# Patient Record
Sex: Female | Born: 1970 | Race: Black or African American | Hispanic: No | Marital: Single | Smoking: Never smoker
Health system: Southern US, Community
[De-identification: ages and names within clinical notes are randomized; demographics above are authoritative.]

## PROBLEM LIST (undated history)

## (undated) DIAGNOSIS — I10 Essential (primary) hypertension: Secondary | ICD-10-CM

## (undated) HISTORY — DX: Essential (primary) hypertension: I10

---

## 1997-09-30 ENCOUNTER — Emergency Department (HOSPITAL_COMMUNITY): Admission: EM | Admit: 1997-09-30 | Discharge: 1997-09-30 | Payer: Self-pay | Admitting: Emergency Medicine

## 1997-10-01 ENCOUNTER — Encounter: Admission: RE | Admit: 1997-10-01 | Discharge: 1997-12-30 | Payer: Self-pay | Admitting: Internal Medicine

## 1999-05-15 ENCOUNTER — Emergency Department (HOSPITAL_COMMUNITY): Admission: EM | Admit: 1999-05-15 | Discharge: 1999-05-15 | Payer: Self-pay | Admitting: Internal Medicine

## 1999-11-22 ENCOUNTER — Other Ambulatory Visit: Admission: RE | Admit: 1999-11-22 | Discharge: 1999-11-22 | Payer: Self-pay | Admitting: *Deleted

## 2003-05-06 ENCOUNTER — Ambulatory Visit (HOSPITAL_COMMUNITY): Admission: RE | Admit: 2003-05-06 | Discharge: 2003-05-06 | Payer: Self-pay | Admitting: Family Medicine

## 2004-09-03 ENCOUNTER — Other Ambulatory Visit: Admission: RE | Admit: 2004-09-03 | Discharge: 2004-09-03 | Payer: Self-pay | Admitting: Family Medicine

## 2005-01-20 IMAGING — US US PELVIS COMPLETE MODIFY
1 series · 14 of 25 positions shown · non-contrast
Comparison: none

CLINICAL DATA: Left lower quadrant pain.
 TRANSABDOMINAL AND TRANSVAGINAL PELVIC ULTRASOUND 
 The uterus measures 8.4 x 4 x 4.5 cm.  There are two fundal fibroids, one on the right and one on the left measuring 2.1 x 2 cm and 1.6 x 1.5 cm.  The endometrium measures less than 6 mm. The right ovary measures 2.3 x 1.3 x 1.4 cm.  The left ovary measures 3.4 x 2.5 x 2.1 cm.  There is a 1 cm follicular cyst.  There is no free fluid.
 IMPRESSION
 1.  Two small fibroids of the uterine fundus measuring less than 2.1 cm.
 2.  Normal endometrium.
 3.  Normal ovaries for the patient?s age with no free fluid.

[Series 1: unknown · 0.26mm/px · 14 of 60 slices shown]
[im 1/60]
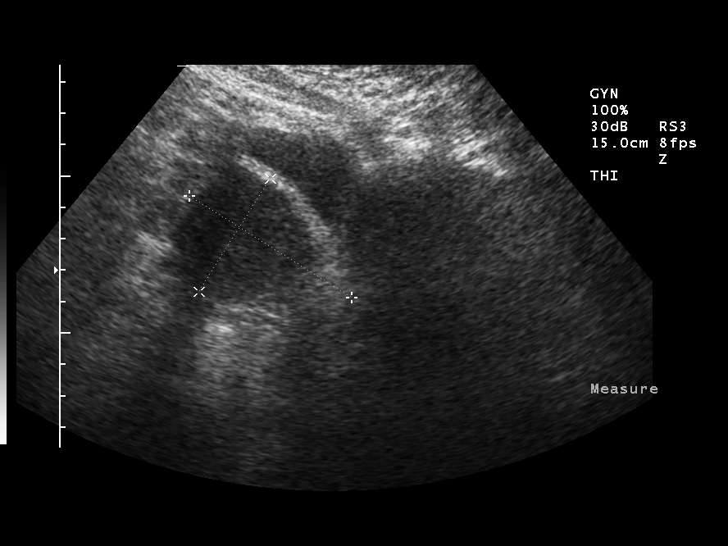
[im 5/60]
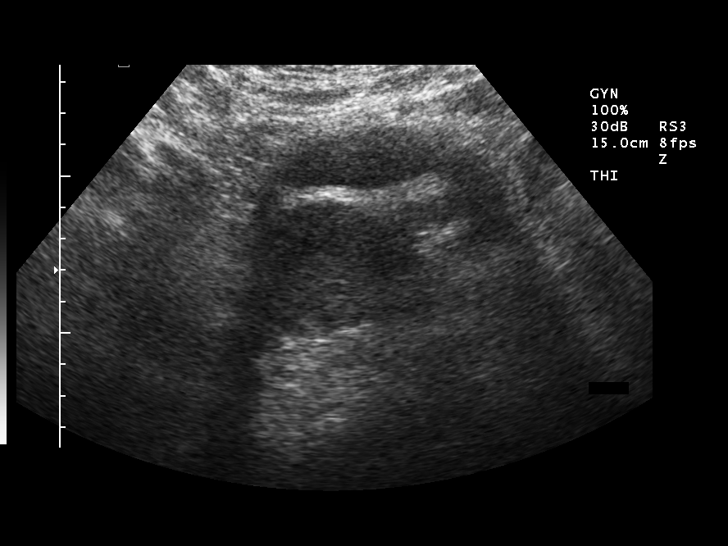
[im 10/60]
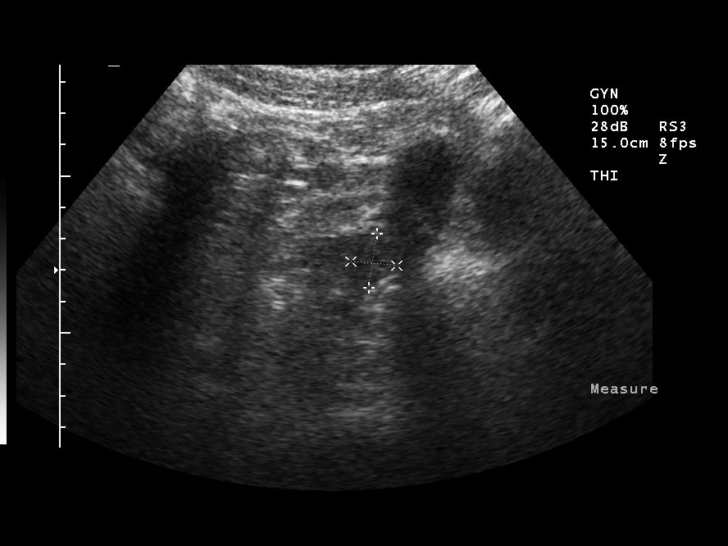
[im 15/60]
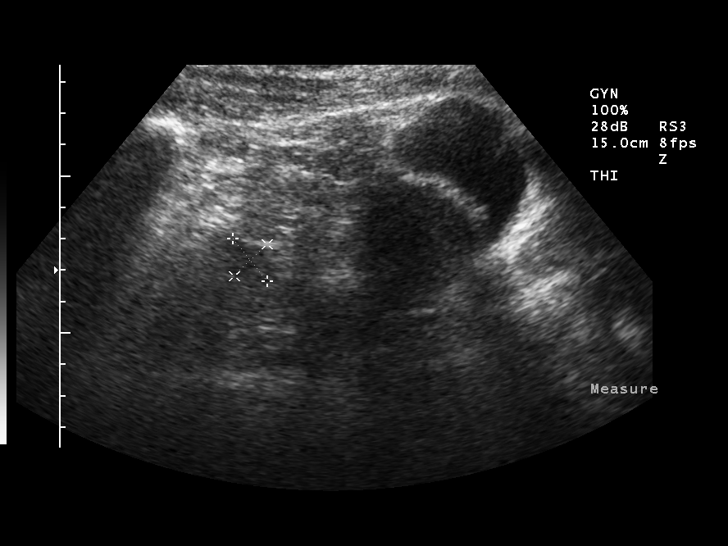
[im 20/60]
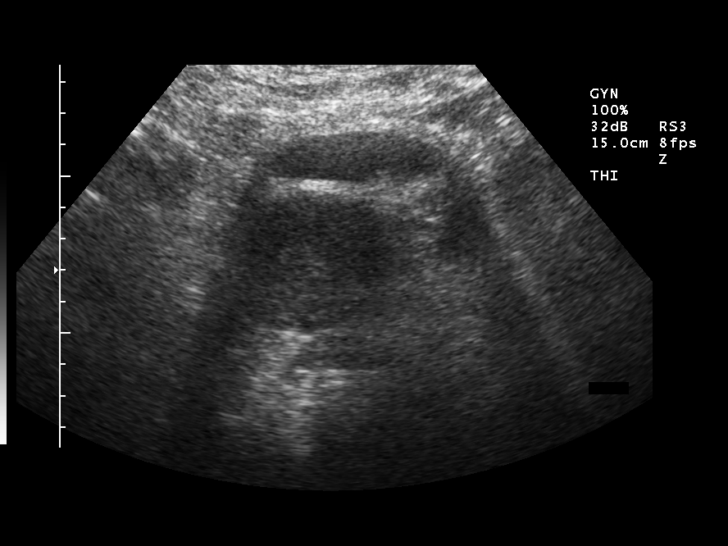
[im 23/60]
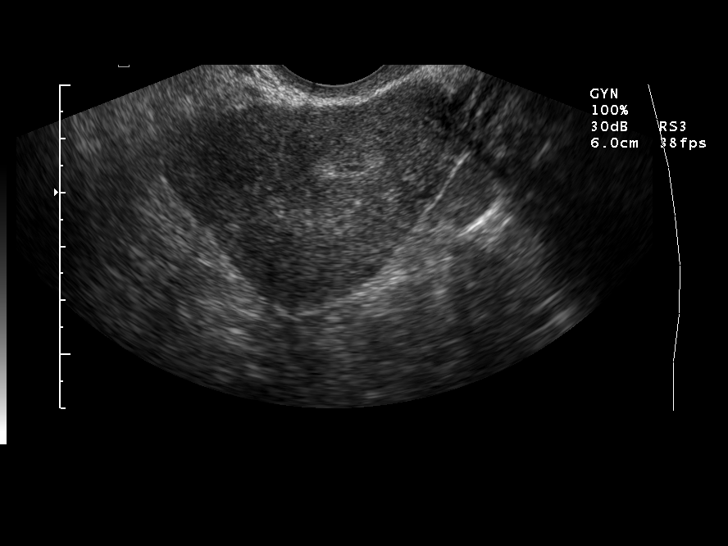
[im 28/60]
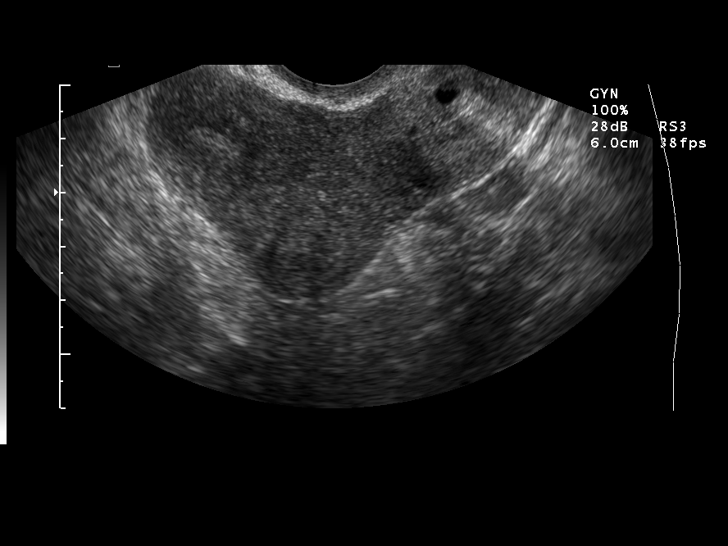
[im 32/60]
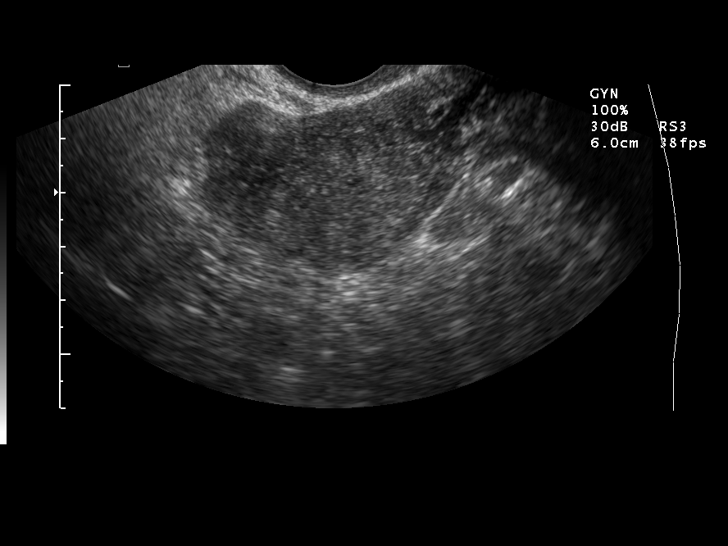
[im 37/60]
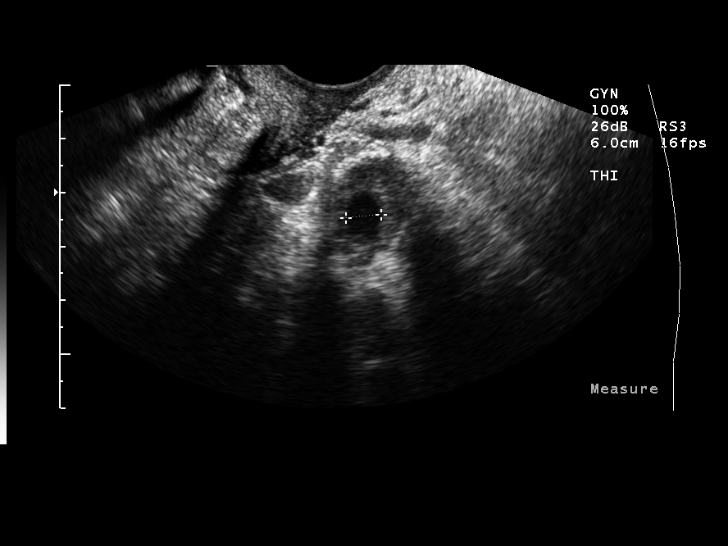
[im 40/60]
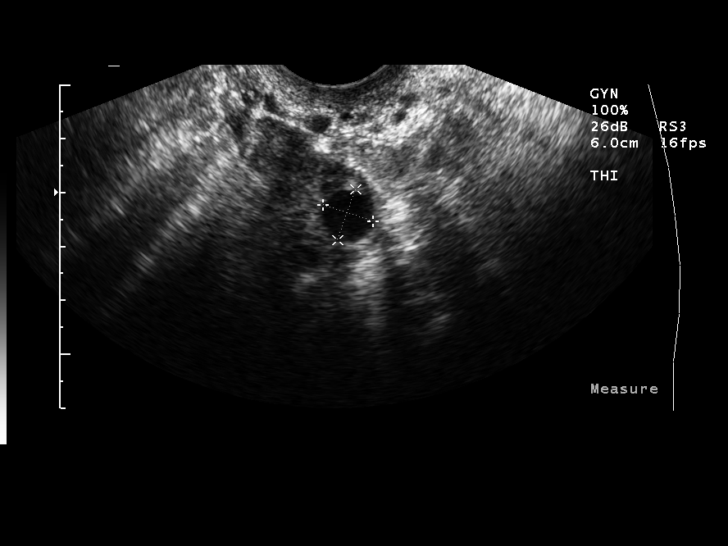
[im 45/60]
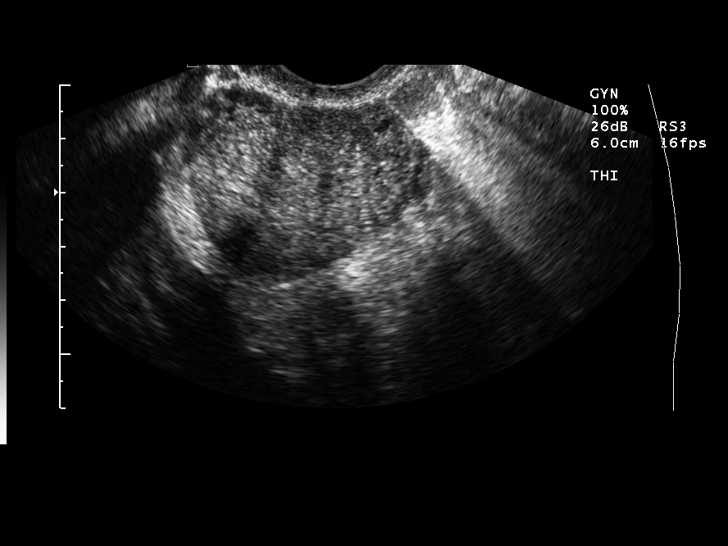
[im 50/60]
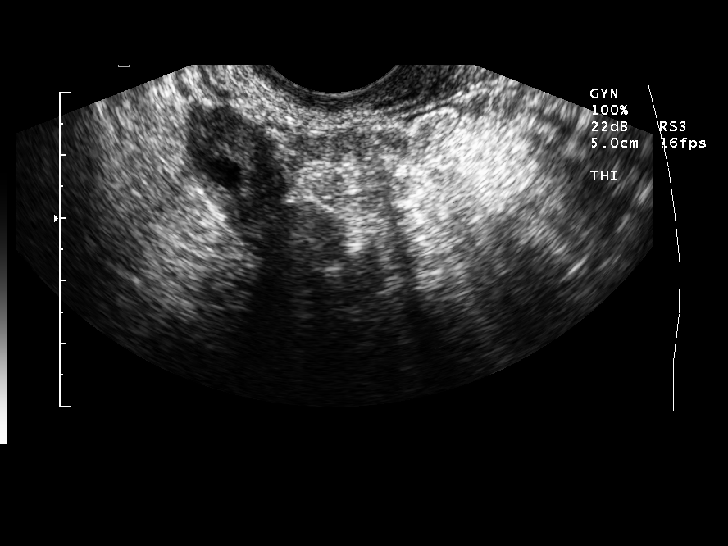
[im 55/60]
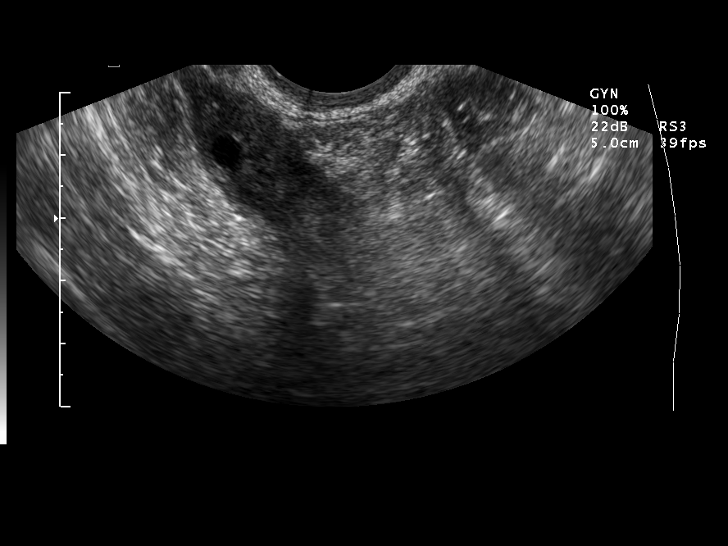
[im 60/60]
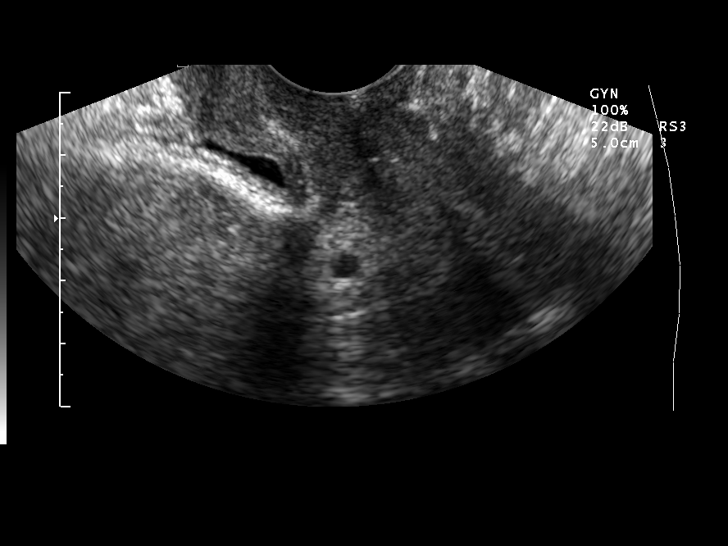

[14 of 25 positions shown; findings below may reference images not displayed]

## 2006-06-19 ENCOUNTER — Other Ambulatory Visit: Admission: RE | Admit: 2006-06-19 | Discharge: 2006-06-19 | Payer: Self-pay | Admitting: Family Medicine

## 2007-06-04 ENCOUNTER — Other Ambulatory Visit: Admission: RE | Admit: 2007-06-04 | Discharge: 2007-06-04 | Payer: Self-pay | Admitting: Family Medicine

## 2008-06-09 ENCOUNTER — Other Ambulatory Visit: Admission: RE | Admit: 2008-06-09 | Discharge: 2008-06-09 | Payer: Self-pay | Admitting: Family Medicine

## 2009-07-13 ENCOUNTER — Other Ambulatory Visit: Admission: RE | Admit: 2009-07-13 | Discharge: 2009-07-13 | Payer: Self-pay | Admitting: Family Medicine

## 2010-11-12 ENCOUNTER — Emergency Department (HOSPITAL_COMMUNITY)
Admission: EM | Admit: 2010-11-12 | Discharge: 2010-11-12 | Disposition: A | Discharge status: Home / Self Care | Payer: No Typology Code available for payment source | Attending: Emergency Medicine | Admitting: Emergency Medicine

## 2010-11-12 DIAGNOSIS — S335XXA Sprain of ligaments of lumbar spine, initial encounter: Secondary | ICD-10-CM | POA: Insufficient documentation

## 2010-11-12 DIAGNOSIS — Y92410 Unspecified street and highway as the place of occurrence of the external cause: Secondary | ICD-10-CM | POA: Insufficient documentation

## 2010-11-12 DIAGNOSIS — S139XXA Sprain of joints and ligaments of unspecified parts of neck, initial encounter: Secondary | ICD-10-CM | POA: Insufficient documentation

## 2011-08-15 ENCOUNTER — Other Ambulatory Visit: Payer: Self-pay | Admitting: Family Medicine

## 2011-08-15 ENCOUNTER — Other Ambulatory Visit (HOSPITAL_COMMUNITY)
Admission: RE | Admit: 2011-08-15 | Discharge: 2011-08-15 | Disposition: A | Discharge status: Home / Self Care | Payer: BC Managed Care – PPO | Source: Ambulatory Visit | Attending: Family Medicine | Admitting: Family Medicine

## 2011-08-15 DIAGNOSIS — Z01419 Encounter for gynecological examination (general) (routine) without abnormal findings: Secondary | ICD-10-CM | POA: Insufficient documentation

## 2011-08-29 ENCOUNTER — Other Ambulatory Visit: Payer: Self-pay | Admitting: Radiology

## 2013-11-18 ENCOUNTER — Other Ambulatory Visit (HOSPITAL_COMMUNITY)
Admission: RE | Admit: 2013-11-18 | Discharge: 2013-11-18 | Disposition: A | Discharge status: Home / Self Care | Payer: BC Managed Care – PPO | Source: Ambulatory Visit | Attending: Family Medicine | Admitting: Family Medicine

## 2013-11-18 ENCOUNTER — Other Ambulatory Visit: Payer: Self-pay | Admitting: Family Medicine

## 2013-11-18 DIAGNOSIS — Z0000 Encounter for general adult medical examination without abnormal findings: Secondary | ICD-10-CM | POA: Insufficient documentation

## 2013-11-20 LAB — CYTOLOGY - PAP

## 2016-11-14 ENCOUNTER — Other Ambulatory Visit (HOSPITAL_COMMUNITY)
Admission: RE | Admit: 2016-11-14 | Discharge: 2016-11-14 | Disposition: A | Discharge status: Home / Self Care | Payer: BLUE CROSS/BLUE SHIELD | Source: Ambulatory Visit | Attending: Family Medicine | Admitting: Family Medicine

## 2016-11-14 ENCOUNTER — Other Ambulatory Visit: Payer: Self-pay | Admitting: Family Medicine

## 2016-11-14 DIAGNOSIS — Z01411 Encounter for gynecological examination (general) (routine) with abnormal findings: Secondary | ICD-10-CM | POA: Insufficient documentation

## 2016-11-17 LAB — CYTOLOGY - PAP: Diagnosis: NEGATIVE

## 2018-04-14 ENCOUNTER — Other Ambulatory Visit: Payer: Self-pay

## 2018-04-14 ENCOUNTER — Ambulatory Visit (HOSPITAL_COMMUNITY)
Admission: EM | Admit: 2018-04-14 | Discharge: 2018-04-14 | Disposition: A | Discharge status: Home / Self Care | Payer: BLUE CROSS/BLUE SHIELD | Attending: Family Medicine | Admitting: Family Medicine

## 2018-04-14 ENCOUNTER — Encounter (HOSPITAL_COMMUNITY): Payer: Self-pay | Admitting: Emergency Medicine

## 2018-04-14 DIAGNOSIS — R21 Rash and other nonspecific skin eruption: Secondary | ICD-10-CM | POA: Insufficient documentation

## 2018-04-14 MED ORDER — PREDNISONE 10 MG (21) PO TBPK: ORAL_TABLET | Freq: Every day | ORAL | 0 refills | Status: DC

## 2018-04-14 MED ORDER — VALACYCLOVIR HCL 1 G PO TABS: 1000.0000 mg | ORAL_TABLET | Freq: Three times a day (TID) | ORAL | 0 refills | Status: DC

## 2018-04-14 NOTE — ED Triage Notes (Signed)
[facility] with a complaint of a painful rash to the right side of her face only x 2 days.

## 2018-04-17 NOTE — ED Provider Notes (Signed)
[facility]   [id number] 04/14/18 Arrival Time: 1347  ASSESSMENT & PLAN:  1. Rash and nonspecific skin eruption    Less than 24 hours duration. Question shingles vs contact dermatitis. Discussed.  Meds ordered this encounter  Medications  . predniSONE (STERAPRED UNI-PAK 21 TAB) 10 MG (21) TBPK tablet    Sig: Take by mouth daily. Take as directed.    Dispense:  21 tablet    Refill:  0  . valACYclovir (VALTREX) 1000 MG tablet    Sig: Take 1 tablet (1,000 mg total) by mouth 3 (three) times daily.    Dispense:  21 tablet    Refill:  0   Will follow up with PCP or here if worsening or failing to improve as anticipated. Reviewed expectations re: course of current medical issues. Questions answered. Outlined signs and symptoms indicating need for more acute intervention. Patient verbalized understanding. After Visit Summary given.   SUBJECTIVE:  Jennifer West is a 47 y.o. female who presents with a skin complaint.   Location: R side of face Onset: first noticed today; not much change since this morning Associated pruritis? mild Associated pain? mild Progression: stable  Drainage? No  Known trigger? No  New soaps/lotions/topicals/detergents/environmental exposures? No Contacts with similar? No Recent travel? No  Other associated symptoms: none Therapies tried thus far: none Arthralgia or myalgia? none Recent illness? none Fever? none No specific aggravating or alleviating factors reported.  ROS: As per HPI.  OBJECTIVE: Vitals:   04/14/18 1536  BP: (!) 145/91  Pulse: 75  Resp: 18  Temp: 99.5 F (37.5 C)  TempSrc: Oral  SpO2: 98%    General appearance: alert; no distress Lungs: clear to auscultation bilaterally Heart: regular rate and rhythm Extremities: no edema Skin: warm and dry; signs of bacterial infection: no; 2-3 crops of red papules over R temporal area of face; no crusting; mildly tender to touch Psychological: alert and cooperative;  normal mood and affect  No Known Allergies  PMH: No h/o shingles.  Social History   Socioeconomic History  . Marital status: Single    Spouse name: Not on file  . Number of children: Not on file  . Years of education: Not on file  . Highest education level: Not on file  Occupational History  . Not on file  Social Needs  . Financial resource strain: Not on file  . Food insecurity:    Worry: Not on file    Inability: Not on file  . Transportation needs:    Medical: Not on file    Non-medical: Not on file  Tobacco Use  . Smoking status: Never Smoker  . Smokeless tobacco: Never Used  Substance and Sexual Activity  . Alcohol use: Not Currently    Frequency: Never  . Drug use: Not Currently  . Sexual activity: Not on file  Lifestyle  . Physical activity:    Days per week: Not on file    Minutes per session: Not on file  . Stress: Not on file  Relationships  . Social connections:    Talks on phone: Not on file    Gets together: Not on file    Attends religious service: Not on file    Active member of club or organization: Not on file    Attends meetings of clubs or organizations: Not on file    Relationship status: Not on file  . Intimate partner violence:    Fear of current or ex partner: Not on file  Emotionally abused: Not on file    Physically abused: Not on file    Forced sexual activity: Not on file  Other Topics Concern  . Not on file  Social History Narrative  . Not on file   History reviewed. No pertinent family history. History reviewed. No pertinent surgical history.   Mardella LaymanHagler, Lj Miyamoto, MD 04/17/18 [phone]

## 2019-10-22 ENCOUNTER — Other Ambulatory Visit: Payer: Self-pay | Admitting: Family Medicine

## 2019-10-22 ENCOUNTER — Other Ambulatory Visit (HOSPITAL_COMMUNITY)
Admission: RE | Admit: 2019-10-22 | Discharge: 2019-10-22 | Disposition: A | Discharge status: Home / Self Care | Payer: Self-pay | Source: Ambulatory Visit | Attending: Family Medicine | Admitting: Family Medicine

## 2019-10-22 DIAGNOSIS — Z124 Encounter for screening for malignant neoplasm of cervix: Secondary | ICD-10-CM | POA: Insufficient documentation

## 2019-10-24 LAB — CYTOLOGY - PAP: Diagnosis: NEGATIVE

## 2020-11-24 ENCOUNTER — Ambulatory Visit (HOSPITAL_COMMUNITY)
Admission: RE | Admit: 2020-11-24 | Discharge: 2020-11-24 | Disposition: A | Discharge status: Home / Self Care | Payer: 59 | Source: Ambulatory Visit | Attending: Cardiology | Admitting: Cardiology

## 2020-11-24 ENCOUNTER — Other Ambulatory Visit (HOSPITAL_COMMUNITY): Payer: Self-pay

## 2020-11-24 ENCOUNTER — Other Ambulatory Visit (HOSPITAL_COMMUNITY): Payer: Self-pay | Admitting: Family Medicine

## 2020-11-24 ENCOUNTER — Other Ambulatory Visit: Payer: Self-pay

## 2020-11-24 DIAGNOSIS — M7989 Other specified soft tissue disorders: Secondary | ICD-10-CM | POA: Diagnosis not present

## 2020-11-24 DIAGNOSIS — M79605 Pain in left leg: Secondary | ICD-10-CM

## 2024-01-01 ENCOUNTER — Encounter: Payer: Self-pay | Admitting: Neurology

## 2024-01-29 NOTE — Progress Notes (Unsigned)
 NEUROLOGY CONSULTATION NOTE  Jennifer West MRN: [id number] DOB: 1970/08/24  Referring provider: Alfrieda Pillar, DO Primary care provider: Alfrieda Pillar, DO  Reason for consult:  headache  Assessment/Plan:   Cervicogenic headache/bilateral occipital neuralgia Cervicalgia   Refer to physical therapy for neck pain/headache.  If PT ineffective, would start a preventative medication such as nortriptyline. Instead of the acetaminophen-caffeine tablet, I have prescribed baclofen 10mg  to take twice daily as needed for acute headaches.  Advised to go ahead and take 10mg  every night at bedtime as well.  Ultimately, if the acetaminophen-caffeine tablet is most effective, she may take it but limit to no more than 9 days out of the week. Advised to try changing her pillow, consider non-contoured memory foam pillow. Follow up 6 months.   Subjective:  Jennifer West is a 53 year old right-handed female with HTN who presents for headache.  History supplemented by referring provider's note.  On 12/25/2023, she woke up in the morning with new headache.  She described a 10/10 pressure across the forehead and in the back of the neck and shoulders.  In addition, she experienced paroxysmal shooting pain from the back of her neck and head bilaterally radiating to the front.  Neck movements aggravated it.  No associated nausea, vomiting, photophobia, phonophobia, visual disturbance, dizziness, weakness, pain or numbness in the arms and hands.  Denied preceding injury.  The headache was persistent for an entire week.  She went to Urgent care the next day where she received a Toradol and Decadron shots that aggravated it.  She also took a prednisone  taper which also worsened the headache.  She was out of work for 2 days.  She started taking generic acetaminophen-caffeine pills which helped.  Since then, the headaches are less severe, about 4-5/10, and occurs every 2 days for 1 to 2 days.  Often wakes up with the neck pain and  headache.  She says she usually sleeps well.  No prior history of headache.  X-ray of cervical spine on 12/26/2023 revealed mild multilevel cervical spondylosis with small multilevel ventral endplate spurring and tiny chronic anterior osteophyte fragmentation at the C5 anterior-inferior endplate.  CT head on 12/27/2023 showed nonspecific disproportionate superior cervical volume loss but no acute intracranial abnormality  Past NSAIDS/analgesics:   ibuprofen 800mg , tramadol 25mg  at bedtime PRN (headaches), Toradol IM Past abortive triptans:  none Past abortive ergotamine:  none Past muscle relaxants:  none Past anti-emetic:  none Past antihypertensive medications:  none Past antidepressant medications:  none Past anticonvulsant medications:  none Past anti-CGRP:  none Past vitamins/Herbal/Supplements:  none Past antihistamines/decongestants:  Flonase Other past therapies:  Decadron IM, prednisone   Current NSAIDS/analgesics: acetaminophen-caffeine tablets Current triptans:  none Current ergotamine:  none Current anti-emetic:  none Current muscle relaxants:  tizanidine 4mg  at bedtime PRN Current Antihypertensive medications:  olmesartan-HCTZ Current Antidepressant medications:  none Current Anticonvulsant medications:  none Current anti-CGRP:  none Current Vitamins/Herbal/Supplements:  none Current Antihistamines/Decongestants: none Other therapy:  none Birth control:  Tri-Sprintec    Caffeine:  Mt Dew once a week.  No coffee or energy drinks Alcohol:  no Smoker:  no Diet:  16 oz water daily.  Chicken, fish, vegetables, salad, fruit Exercise:  walks  Depression:  no; Anxiety:  no Sleep hygiene:  usually good.  History of TBI/concussion:  no Family history of headache:  non Family history of cerebral aneurysm:  no      PAST MEDICAL HISTORY: Past Medical History:  Diagnosis Date  Hypertension      PAST SURGICAL HISTORY: History reviewed. No pertinent surgical  history.   MEDICATIONS: Current Outpatient Medications on File Prior to Visit  Medication Sig Dispense Refill   Norgestimate-Ethinyl Estradiol Triphasic (TRI-SPRINTEC) 0.18/0.215/0.25 MG-35 MCG tablet Take 1 tablet by mouth daily.     olmesartan-hydrochlorothiazide (BENICAR HCT) 40-25 MG tablet Take 1 tablet by mouth every morning.     No current facility-administered medications on file prior to visit.     ALLERGIES: No Known Allergies  FAMILY HISTORY: History reviewed. No pertinent family history.  Objective:  Blood pressure 123/76, pulse 89, height 5' 6 (1.676 m), weight 177 lb (80.3 kg), SpO2 99%. General: No acute distress.  Patient appears well-groomed.   Head:  Normocephalic/atraumatic Eyes:  fundi examined but not visualized Neck: supple, left suboccipital tenderness and bilateral paraspinal tenderness, full range of motion Heart: regular rate and rhythm Neurological Exam: Mental status: alert and oriented to person, place, and time, speech fluent and not dysarthric, language intact. Cranial nerves: CN I: not tested CN II: pupils equal, round and reactive to light, visual fields intact CN III, IV, VI:  full range of motion, no nystagmus, no ptosis CN V: facial sensation intact. CN VII: upper and lower face symmetric CN VIII: hearing intact CN IX, X: gag intact, uvula midline CN XI: sternocleidomastoid and trapezius muscles intact CN XII: tongue midline Bulk & Tone: normal, no fasciculations. Motor:  muscle strength 5/5 throughout Sensation:  Pinprick and vibratory sensation intact. Deep Tendon Reflexes:  2+ throughout,  toes downgoing.   Finger to nose testing:  Without dysmetria.   Gait:  Normal station and stride.  Romberg negative.    Thank you for allowing me to take part in the care of this patient.  Juliene Dunnings, DO  CC: Alfrieda Pillar, DO

## 2024-01-30 ENCOUNTER — Ambulatory Visit: Admitting: Neurology

## 2024-01-30 ENCOUNTER — Encounter: Payer: Self-pay | Admitting: Neurology

## 2024-01-30 VITALS — BP 123/76 | HR 89 | Ht 66.0 in | Wt 177.0 lb

## 2024-01-30 DIAGNOSIS — G4486 Cervicogenic headache: Secondary | ICD-10-CM

## 2024-01-30 MED ORDER — BACLOFEN 10 MG PO TABS: ORAL_TABLET | ORAL | 5 refills | Status: AC

## 2024-01-30 NOTE — Patient Instructions (Addendum)
 Refer to physical therapy for neck pain/cervicogenic headache.  If no improvement after 2 months or when physical therapy is finished before then, contact me and we can start a daily preventative medication. Take baclofen 10mg  at bedtime.  Take take 1 tablet up to 2 times daily as needed for acute headache.  Caution for drowsiness Stop the tension-migraine pill.  If needed, at least limit to no more than 9 days out of the month to prevent rebound headache Try using a non-contoured memory foam pillow to sleep Keep headache diary Follow up 6 months.

## 2024-04-29 ENCOUNTER — Other Ambulatory Visit: Payer: Self-pay | Admitting: Family Medicine

## 2024-04-29 DIAGNOSIS — Z1231 Encounter for screening mammogram for malignant neoplasm of breast: Secondary | ICD-10-CM

## 2024-05-01 ENCOUNTER — Telehealth: Payer: Self-pay | Admitting: Neurology

## 2024-05-01 DIAGNOSIS — G4486 Cervicogenic headache: Secondary | ICD-10-CM

## 2024-05-01 NOTE — Telephone Encounter (Signed)
 Referral sent via epic [phone]

## 2024-05-01 NOTE — Telephone Encounter (Signed)
 Pt called in this afternoon and wants a referral to go to : [facility], because they will  take her current insurance. Thanks

## 2024-05-02 ENCOUNTER — Ambulatory Visit: Payer: Self-pay | Admitting: Neurology

## 2024-05-06 ENCOUNTER — Telehealth: Payer: Self-pay | Admitting: Neurology

## 2024-05-06 NOTE — Telephone Encounter (Signed)
 Pt would like another Referral to [facility], pls

## 2024-05-06 NOTE — Telephone Encounter (Signed)
 Tried calling patient no answer.   Are asking for PT at [facility].

## 2024-05-07 ENCOUNTER — Telehealth: Payer: Self-pay | Admitting: Neurology

## 2024-05-07 NOTE — Telephone Encounter (Signed)
 LMOVM to call the office back, Received a call on 05/01/24 to change the location to [facility].

## 2024-05-07 NOTE — Telephone Encounter (Signed)
 Pt called in this afternoon and she stated that she is returning a call. Pt stated that she did not know what the call is  about. Thanks

## 2024-05-08 ENCOUNTER — Ambulatory Visit

## 2024-05-08 ENCOUNTER — Ambulatory Visit
Admission: RE | Admit: 2024-05-08 | Discharge: 2024-05-08 | Disposition: A | Source: Ambulatory Visit | Attending: Family Medicine | Admitting: Family Medicine

## 2024-05-08 DIAGNOSIS — Z1231 Encounter for screening mammogram for malignant neoplasm of breast: Secondary | ICD-10-CM

## 2024-05-08 NOTE — Telephone Encounter (Signed)
 See 05/06/24 note.

## 2024-08-27 ENCOUNTER — Ambulatory Visit: Payer: Self-pay | Admitting: Neurology
